# Patient Record
Sex: Male | Born: 1995 | Race: White | Hispanic: No | Marital: Single | Smoking: Current every day smoker
Health system: Southern US, Community
[De-identification: ages and names within clinical notes are randomized; demographics above are authoritative.]

---
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
## 2013-05-11 ENCOUNTER — Ambulatory Visit: Payer: Self-pay | Admitting: Pediatrics

## 2013-05-11 LAB — LIPID PANEL
Cholesterol: 169 mg/dL (ref 101–218)
HDL Cholesterol: 42 mg/dL (ref 40–60)
Ldl Cholesterol, Calc: 106 mg/dL — ABNORMAL HIGH (ref 0–100)
Triglycerides: 104 mg/dL (ref 0–135)
VLDL Cholesterol, Calc: 21 mg/dL (ref 5–40)

## 2013-05-11 LAB — BASIC METABOLIC PANEL
BUN: 13 mg/dL (ref 9–21)
Calcium, Total: 9.5 mg/dL (ref 9.0–10.7)
Chloride: 104 mmol/L (ref 97–107)
Co2: 28 mmol/L — ABNORMAL HIGH (ref 16–25)
Creatinine: 0.93 mg/dL (ref 0.60–1.30)
EGFR (African American): >60
EGFR (Non-African Amer.): >60
Potassium: 3.8 mmol/L (ref 3.3–4.7)

## 2016-11-29 ENCOUNTER — Ambulatory Visit
Admission: EM | Admit: 2016-11-29 | Discharge: 2016-11-29 | Disposition: A | Discharge status: Home / Self Care | Payer: Self-pay | Attending: Family Medicine | Admitting: Family Medicine

## 2016-11-29 ENCOUNTER — Encounter: Payer: Self-pay | Admitting: Gynecology

## 2016-11-29 DIAGNOSIS — S46811A Strain of other muscles, fascia and tendons at shoulder and upper arm level, right arm, initial encounter: Secondary | ICD-10-CM

## 2016-11-29 MED ORDER — MELOXICAM 15 MG PO TABS: 15.0000 mg | ORAL_TABLET | Freq: Every day | ORAL | 0 refills | Status: DC | PRN

## 2016-11-29 MED ORDER — CYCLOBENZAPRINE HCL 10 MG PO TABS: 10.0000 mg | ORAL_TABLET | Freq: Three times a day (TID) | ORAL | 0 refills | Status: DC | PRN

## 2016-11-29 NOTE — ED Triage Notes (Signed)
Per patient after leaving the gym x yesterday with right shoulder and neck pain.

## 2016-11-29 NOTE — Discharge Instructions (Signed)
Rest, heat, ice.  Medications as needed.  Take care  Dr. Adriana Simasook

## 2016-11-29 NOTE — ED Provider Notes (Signed)
[facility]    CSN: [id number] Arrival date & time: 11/29/16  1116  History   Chief Complaint Chief Complaint  Patient presents with  . Shoulder Pain  . Neck Pain   HPI  21 year old male presents with the above complaints.  Patient states that last night he developed right-sided posterior neck pain and associated pain in the trapezius region. He states that he was in the gym yesterday and was dead lifting and squatting. He thinks this may be the culprit. Pain is moderate in severity. Worse with range of motion. No known relieving factors. No other associated symptoms. No other complaints or concerns at this time.  History reviewed. No pertinent past medical history.  There are no active problems to display for this patient.  History reviewed. No pertinent surgical history.   Home Medications    Prior to Admission medications   Medication Sig Start Date End Date Taking? Authorizing Provider  cyclobenzaprine (FLEXERIL) 10 MG tablet Take 1 tablet (10 mg total) by mouth 3 (three) times daily as needed for muscle spasms. 11/29/16   Tommie Sams, DO  meloxicam (MOBIC) 15 MG tablet Take 1 tablet (15 mg total) by mouth daily as needed for pain. 11/29/16   Tommie Sams, DO    Family History No family history on file.  Social History Social History  Substance Use Topics  . Smoking status: Never Smoker  . Smokeless tobacco: Never Used  . Alcohol use Yes     Allergies   Patient has no known allergies.   Review of Systems Review of Systems  Musculoskeletal: Positive for neck pain.  All other systems reviewed and are negative.  Physical Exam Triage Vital Signs ED Triage Vitals  Enc Vitals Group     BP 11/29/16 1139 (!) 134/59     Pulse Rate 11/29/16 1139 60     Resp 11/29/16 1139 18     Temp 11/29/16 1139 97.7 F (36.5 C)     Temp Source 11/29/16 1139 Oral     SpO2 11/29/16 1139 100 %     Weight 11/29/16 1137 212 lb (96.2 kg)     Height --      Head  Circumference --      Peak Flow --      Pain Score 11/29/16 1138 7     Pain Loc --      Pain Edu? --      Excl. in GC? --    No data found.   Updated Vital Signs BP (!) 134/59 (BP Location: Left Arm)   Pulse 60   Temp 97.7 F (36.5 C) (Oral)   Resp 18   Wt 212 lb (96.2 kg)   SpO2 100%    Physical Exam  Constitutional: He is oriented to person, place, and time. He appears well-developed. No distress.  HENT:  Head: Normocephalic and atraumatic.  Eyes: Conjunctivae are normal.  Cardiovascular: Normal rate and regular rhythm.   Pulmonary/Chest: Effort normal and breath sounds normal.  Abdominal: Soft. He exhibits no distension.  Musculoskeletal:  Neck/trapezius - mildly tender to palpation, right trapezius region.  Neurological: He is alert and oriented to person, place, and time.  Skin: No rash noted.  Psychiatric: He has a normal mood and affect.  Vitals reviewed.  UC Treatments / Results  Labs (all labs ordered are listed, but only abnormal results are displayed) Labs Reviewed - No data to display  EKG  EKG Interpretation None  Radiology No results found.  Procedures Procedures (including critical care time)  Medications Ordered in UC Medications - No data to display   Initial Impression / Assessment and Plan / UC Course  I have reviewed the triage vital signs and the nursing notes.  Pertinent labs & imaging results that were available during my care of the patient were reviewed by me and considered in my medical decision making (see chart for details).    21 year old male presents with MSK pain. Treating with Flexeril and Mobic.  Final Clinical Impressions(s) / UC Diagnoses   Final diagnoses:  Strain of right trapezius muscle, initial encounter    New Prescriptions Discharge Medication List as of 11/29/2016 12:13 PM    START taking these medications   Details  cyclobenzaprine (FLEXERIL) 10 MG tablet Take 1 tablet (10 mg total) by mouth 3  (three) times daily as needed for muscle spasms., Starting Sun 11/29/2016, Normal    meloxicam (MOBIC) 15 MG tablet Take 1 tablet (15 mg total) by mouth daily as needed for pain., Starting Sun 11/29/2016, Normal         Tommie SamsJayce G Maudy Yonan, DO 11/29/16 1224

## 2018-04-24 ENCOUNTER — Emergency Department: Payer: Self-pay

## 2018-04-24 ENCOUNTER — Encounter: Payer: Self-pay | Admitting: Emergency Medicine

## 2018-04-24 ENCOUNTER — Emergency Department
Admission: EM | Admit: 2018-04-24 | Discharge: 2018-04-24 | Disposition: A | Discharge status: Home / Self Care | Payer: Self-pay | Attending: Emergency Medicine | Admitting: Emergency Medicine

## 2018-04-24 ENCOUNTER — Other Ambulatory Visit: Payer: Self-pay

## 2018-04-24 DIAGNOSIS — Y999 Unspecified external cause status: Secondary | ICD-10-CM | POA: Insufficient documentation

## 2018-04-24 DIAGNOSIS — W208XXA Other cause of strike by thrown, projected or falling object, initial encounter: Secondary | ICD-10-CM | POA: Insufficient documentation

## 2018-04-24 DIAGNOSIS — F17210 Nicotine dependence, cigarettes, uncomplicated: Secondary | ICD-10-CM | POA: Insufficient documentation

## 2018-04-24 DIAGNOSIS — Y929 Unspecified place or not applicable: Secondary | ICD-10-CM | POA: Insufficient documentation

## 2018-04-24 DIAGNOSIS — Z23 Encounter for immunization: Secondary | ICD-10-CM | POA: Insufficient documentation

## 2018-04-24 DIAGNOSIS — Y93B3 Activity, free weights: Secondary | ICD-10-CM | POA: Insufficient documentation

## 2018-04-24 DIAGNOSIS — S99922A Unspecified injury of left foot, initial encounter: Secondary | ICD-10-CM

## 2018-04-24 DIAGNOSIS — S91112A Laceration without foreign body of left great toe without damage to nail, initial encounter: Secondary | ICD-10-CM | POA: Insufficient documentation

## 2018-04-24 MED ORDER — MELOXICAM 15 MG PO TABS: 15.0000 mg | ORAL_TABLET | Freq: Every day | ORAL | 0 refills | Status: AC

## 2018-04-24 MED ORDER — TETANUS-DIPHTH-ACELL PERTUSSIS 5-2.5-18.5 LF-MCG/0.5 IM SUSP
0.5000 mL | Freq: Once | INTRAMUSCULAR | Status: AC
  Administered 2018-04-24: 0.5 mL via INTRAMUSCULAR
  Filled 2018-04-24: qty 0.5

## 2018-04-24 MED ORDER — CEPHALEXIN 500 MG PO CAPS: 1000.0000 mg | ORAL_CAPSULE | Freq: Two times a day (BID) | ORAL | 0 refills | Status: AC

## 2018-04-24 NOTE — ED Triage Notes (Signed)
Pt says he dropped a 5lb weight on the top of his left foot, across top of 1st and 2nd digits, this afternoon while he was weight lifting; areas bruised and swollen; painful to ambulate; has not taken any medication for the pain

## 2018-04-24 NOTE — ED Provider Notes (Signed)
[facility] Emergency Department Provider Note  ____________________________________________  Time seen: Approximately 8:45 PM  I have reviewed the triage vital signs and the nursing notes.   HISTORY  Chief Complaint Foot Pain    HPI Cory Hawkins is a 22 y.o. male who presents the emergency department complaining of left foot injury/pain.  Patient reports that he was lifting weights when he accidentally dropped a 5 pound weight on his first and second digit.  Patient sustained a small laceration to the first digit but is main complaint is pain to the distal second digit.  Patient is able to extend and flex both digits appropriately.  No other injury or complaint.  No medications prior to arrival.  Last tetanus shot was years prior.    History reviewed. No pertinent past medical history.  There are no active problems to display for this patient.   History reviewed. No pertinent surgical history.  Prior to Admission medications   Medication Sig Start Date End Date Taking? Authorizing Provider  cephALEXin (KEFLEX) 500 MG capsule Take 2 capsules (1,000 mg total) by mouth 2 (two) times daily. 04/24/18   Javion Holmer, Delorise Royals, PA-C  meloxicam (MOBIC) 15 MG tablet Take 1 tablet (15 mg total) by mouth daily. 04/24/18   Ermal Brzozowski, Delorise Royals, PA-C    Allergies Kiwi extract  History reviewed. No pertinent family history.  Social History Social History   Tobacco Use  . Smoking status: Current Every Day Smoker    Types: Cigarettes  . Smokeless tobacco: Never Used  Substance Use Topics  . Alcohol use: Yes  . Drug use: Never     Review of Systems  Constitutional: No fever/chills Eyes: No visual changes. Cardiovascular: no chest pain. Respiratory: no cough. No SOB. Gastrointestinal: No abdominal pain.  No nausea, no vomiting.   Musculoskeletal: Positive for pain/injury to the first and second digit of the left foot. Skin: Negative for rash, abrasions,  lacerations, ecchymosis. Neurological: Negative for headaches, focal weakness or numbness. 10-point ROS otherwise negative.  ____________________________________________   PHYSICAL EXAM:  VITAL SIGNS: ED Triage Vitals  Enc Vitals Group     BP 04/24/18 2022 (!) 133/55     Pulse Rate 04/24/18 2022 97     Resp 04/24/18 2022 18     Temp 04/24/18 2022 98.5 F (36.9 C)     Temp Source 04/24/18 2022 Oral     SpO2 04/24/18 2022 99 %     Weight 04/24/18 2023 260 lb (117.9 kg)     Height 04/24/18 2023 6' (1.829 m)     Head Circumference --      Peak Flow --      Pain Score 04/24/18 2022 7     Pain Loc --      Pain Edu? --      Excl. in GC? --      Constitutional: Alert and oriented. Well appearing and in no acute distress. Eyes: Conjunctivae are normal. PERRL. EOMI. Head: Atraumatic. Neck: No stridor.    Cardiovascular: Normal rate, regular rhythm. Normal S1 and S2.  Good peripheral circulation. Respiratory: Normal respiratory effort without tachypnea or retractions. Lungs CTAB. Good air entry to the bases with no decreased or absent breath sounds. Musculoskeletal: Full range of motion to all extremities. No gross deformities appreciated.  Visualization of the left foot reveals superficial laceration to the lateral great toe.  No deformity, edema, ecchymosis noted to the digit.  Full range of motion.  Patient is mildly tender to palpation  over the distal phalanx.  No palpable abnormality.  Sensation capillary refill intact.  Visualization of the left hip reveals ecchymosis over the distal phalanx with no lacerations, abrasions noted.  No deformity.  Mild edema.  Full range of motion to the digit.  Patient is very tender to palpation over the distal phalanx with no palpable abnormality.  Capillary refill less than 2 seconds.  Sensation intact. Neurologic:  Normal speech and language. No gross focal neurologic deficits are appreciated.  Skin:  Skin is warm, dry and intact. No rash  noted. Psychiatric: Mood and affect are normal. Speech and behavior are normal. Patient exhibits appropriate insight and judgement.   ____________________________________________   LABS (all labs ordered are listed, but only abnormal results are displayed)  Labs Reviewed - No data to display ____________________________________________  EKG   ____________________________________________  RADIOLOGY   No results found.  ____________________________________________    PROCEDURES  Procedure(s) performed:    Procedures    Medications  Tdap (BOOSTRIX) injection 0.5 mL (has no administration in time range)     ____________________________________________   INITIAL IMPRESSION / ASSESSMENT AND PLAN / ED COURSE  Pertinent labs & imaging results that were available during my care of the patient were reviewed by me and considered in my medical decision making (see chart for details).  Review of the [place] CSRS was performed in accordance of the [facility] prior to dispensing any controlled drugs.  Clinical Course as of Apr 24 2101  Wynelle LinkSun Apr 24, 2018  2059 Patient presents emergency department with injury to the first and second toe of the left foot.  Patient sustained a superficial laceration that did not require closure to the great toe.  Tetanus shot will be administered.  I recommended x-ray to the patient and he states at this time he does not feel this is necessary.  He reports that he can ambulate, bend and extend both digits.  Patient verbalized that I am unable to determine whether he has a fracture and he verbalizes understanding of same.   [JC]    Clinical Course User Index [JC] Kenae Lindquist, Delorise RoyalsJonathan D, PA-C     Patient's diagnosis is consistent with left foot injury.  Patient accidentally dropped a weight on the left foot.  Patient declines x-ray at this time.  Superficial abrasion that does not require closure.  Wound care instructions provided to patient.  Patient will  be placed on Keflex, meloxicam for symptom improvement and prophylaxis.  Patient is given postop shoe for ambulation.  Patient will follow-up with orthopedics or primary care if symptoms do not improve to the left foot..  Patient is given ED precautions to return to the ED for any worsening or new symptoms.     ____________________________________________  FINAL CLINICAL IMPRESSION(S) / ED DIAGNOSES  Final diagnoses:  Injury of left foot, initial encounter  Laceration of left great toe without foreign body present or damage to nail, initial encounter      NEW MEDICATIONS STARTED DURING THIS VISIT:  ED Discharge Orders        Ordered    meloxicam (MOBIC) 15 MG tablet  Daily     04/24/18 2101    cephALEXin (KEFLEX) 500 MG capsule  2 times daily     04/24/18 2101          This chart was dictated using voice recognition software/Dragon. Despite best efforts to proofread, errors can occur which can change the meaning. Any change was purely unintentional.    Sahana Boyland, Delorise RoyalsJonathan D,  PA-C 04/24/18 2102    Minna Antis, MD 04/25/18 [phone]

## 2018-04-24 NOTE — ED Notes (Signed)
ED Provider at bedside.  Pt reports dropping a 5 pound weight from approx chest height onto right great toe and adjacent toe, laceration appearing at base of great toe, and red at both tips, pt able to flex both toes  Pt refusing xray, xray notified to cancelled
